# Patient Record
Sex: Female | Born: 1975 | Hispanic: Yes | Marital: Married
Health system: Southern US, Community
[De-identification: ages and names within clinical notes are randomized; demographics above are authoritative.]

---

## 2007-04-08 ENCOUNTER — Ambulatory Visit: Payer: Self-pay

## 2007-08-15 ENCOUNTER — Inpatient Hospital Stay: Payer: Self-pay | Admitting: Obstetrics and Gynecology

## 2008-01-22 IMAGING — US US OB US >=[ID] SNGL FETUS
1 series · 17 of 28 positions shown · non-contrast
Comparison: none

REASON FOR EXAM: size and dates   needs spanish interpreter
COMMENTS:

[Series 1: us ob us >=(id) sngl fetus · 17 of 64 slices shown]
[im 1/64]
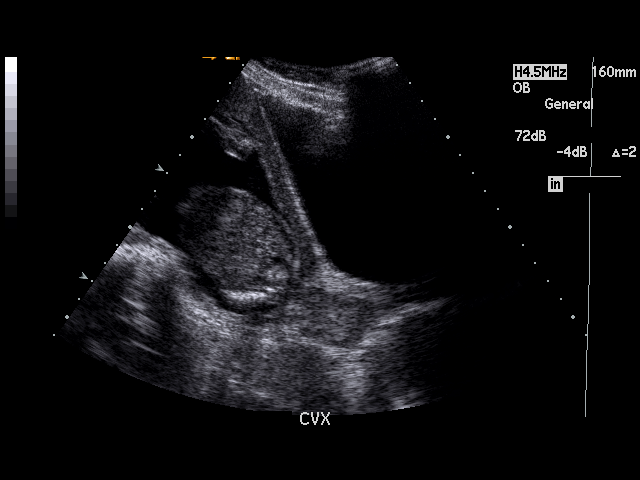
[im 5/64]
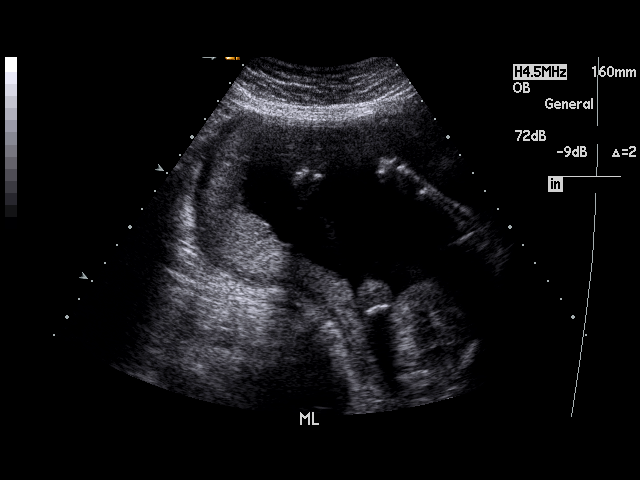
[im 10/64]
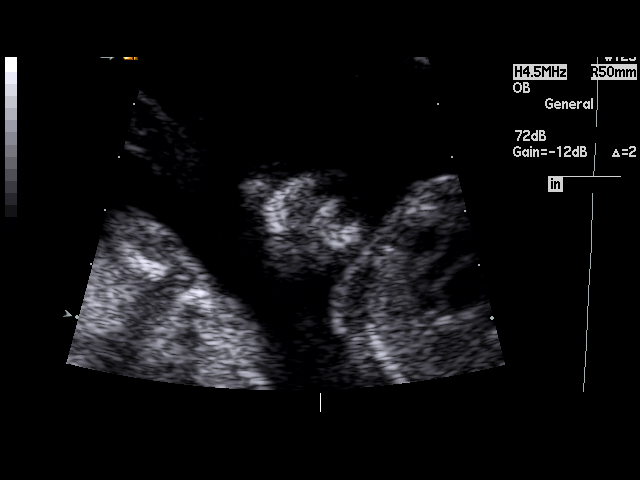
[im 12/64]
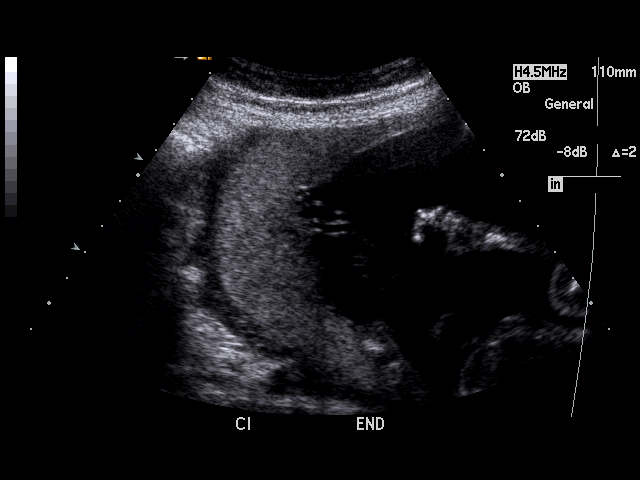
[im 17/64]
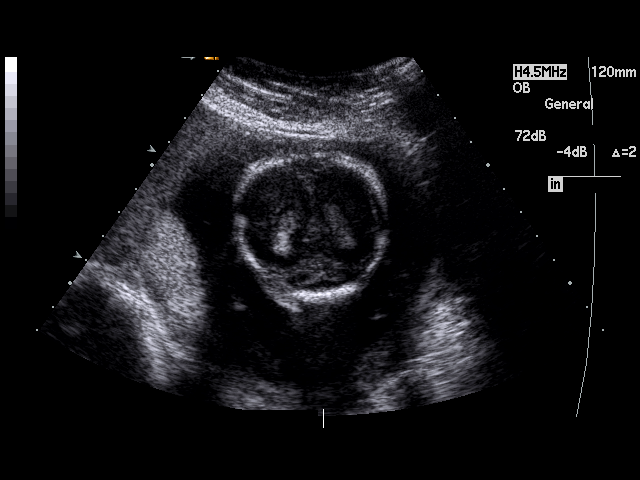
[im 22/64]
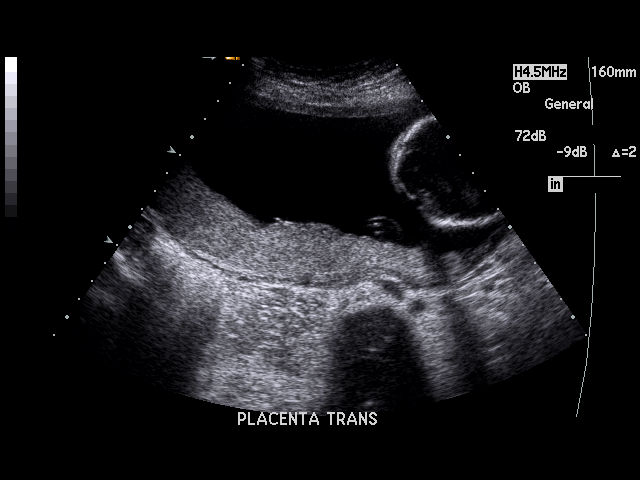
[im 24/64]
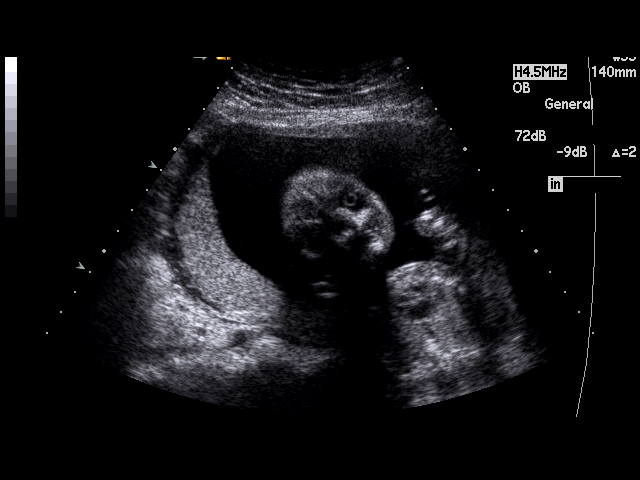
[im 29/64]
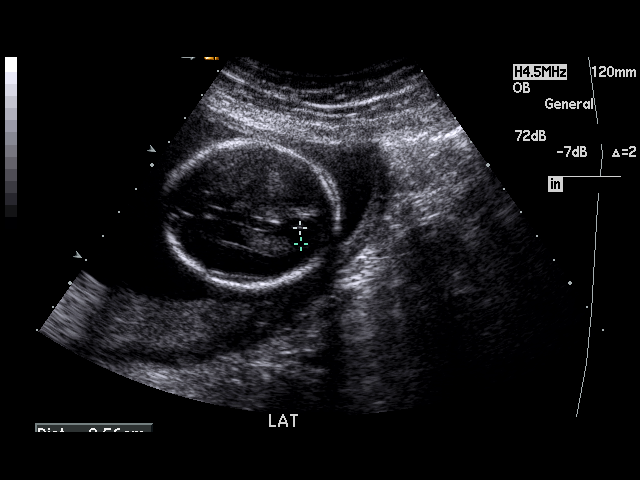
[im 33/64]
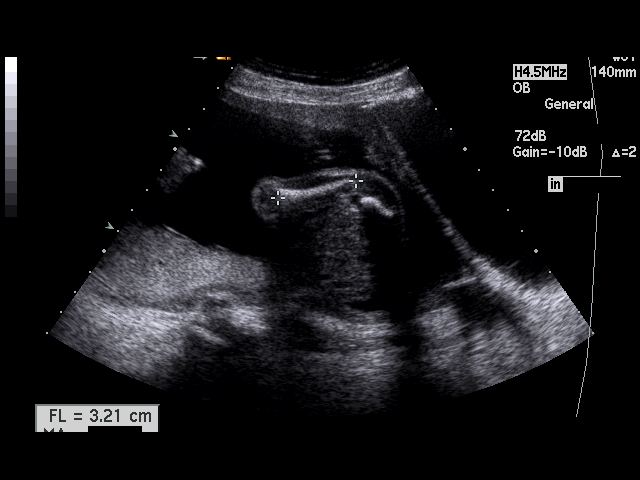
[im 36/64]
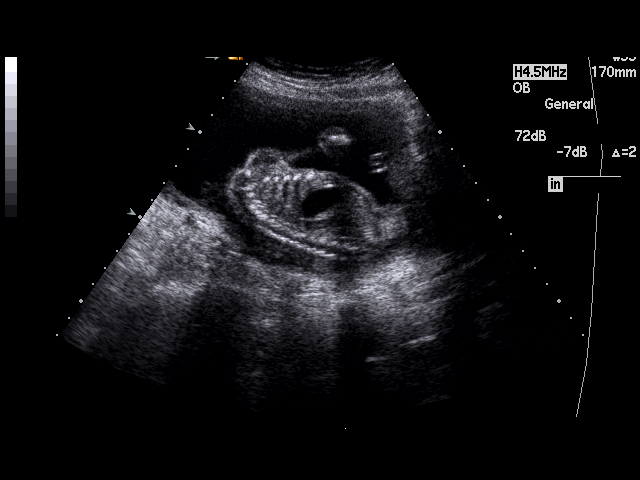
[im 40/64]
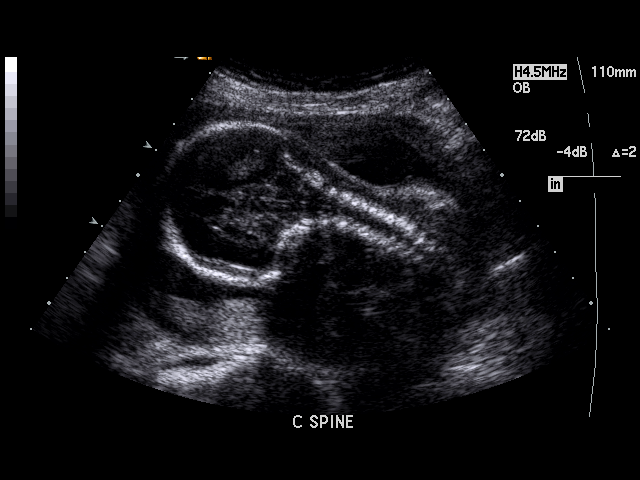
[im 43/64]
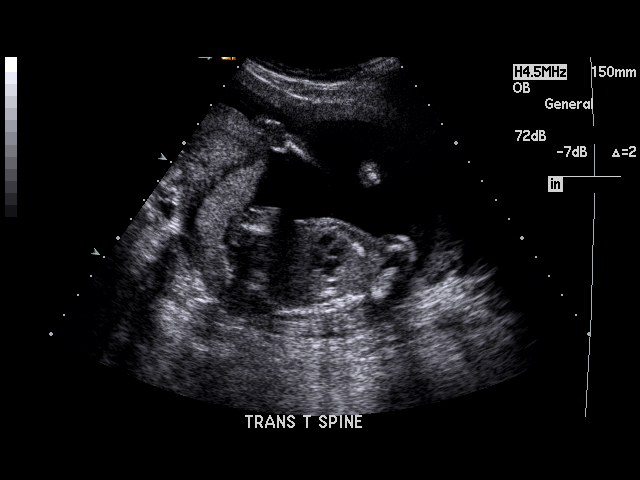
[im 47/64]
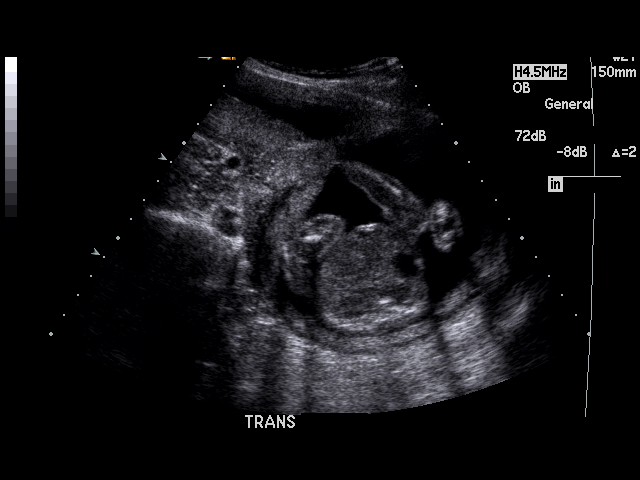
[im 52/64]
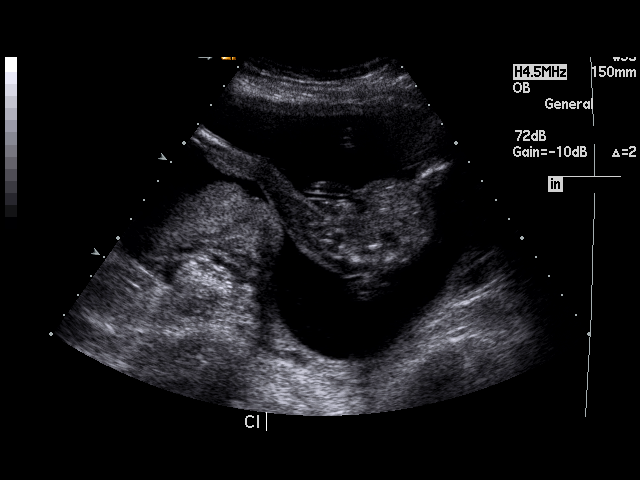
[im 54/64]
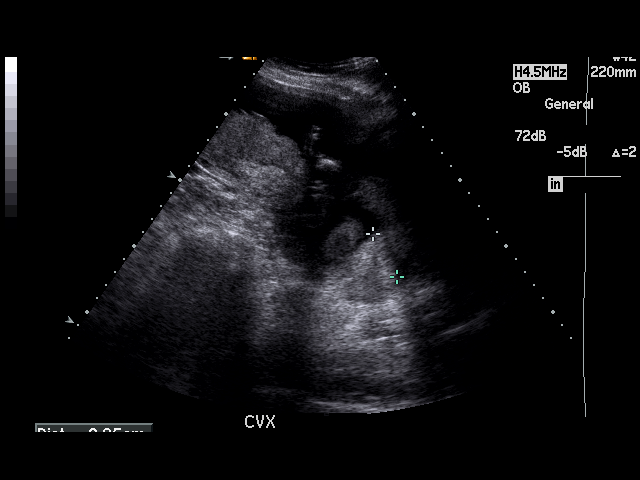
[im 59/64]
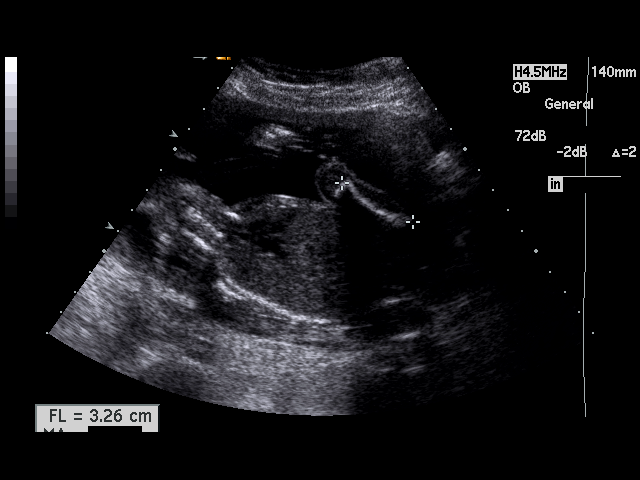
[im 64/64]
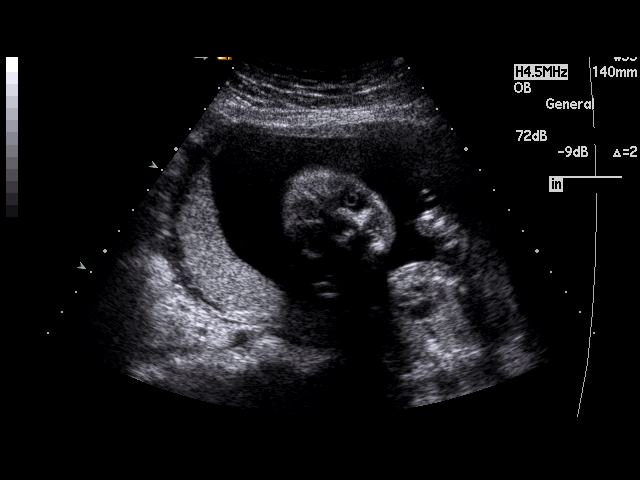

[17 of 28 positions shown; findings below may reference images not displayed]

PROCEDURE:     US  - US OB GREATER/OR EQUAL TO [id number]  - April 08, 2007  [DATE]

RESULT:       A single viable intrauterine pregnancy is appreciated with
estimated gestational age by ultrasound of 20 weeks 3 days.  Cardiac, trunk
and extremity and diaphragmatic movement is appreciated.  Estimated fetal
heart rate is 153 beats per minute. Amniotic fluid index visually appears to
be unremarkable.  The placenta is Grade O and is in a posterior location.
Evaluation of the fetal anatomy demonstrates no gross bladder, kidneys,
stomach, cardiac, diaphragm, spine or ventricular abnormality.  Spine
evaluation is limited secondary to the fetus age.

Fetal biometry:

BPD     5.05 cm     EGA 21 weeks 2 days
HC          17.77 cm     EGA 20 weeks 2 days
AC          15.35 cm     EGA 20 weeks 4 days
FL          3.23 cm     EGA 20 weeks 1 day

Estimated fetal weight is 13 ounces + / - 2 ounces which corresponds to 375
grams + / -50 grams.
IMPRESSION: Single viable intrauterine pregnancy as described above.

## 2012-10-13 ENCOUNTER — Encounter: Payer: Self-pay | Admitting: Obstetrics and Gynecology

## 2013-03-02 ENCOUNTER — Inpatient Hospital Stay: Payer: Self-pay | Admitting: Obstetrics and Gynecology

## 2013-03-02 LAB — CBC WITH DIFFERENTIAL/PLATELET
Basophil #: 0.1 10*3/uL (ref 0.0–0.1)
Basophil %: 0.7 %
HCT: 37.3 % (ref 35.0–47.0)
Lymphocyte %: 20.1 %
MCH: 29.3 pg (ref 26.0–34.0)
Monocyte %: 8.9 %
Neutrophil #: 6.2 10*3/uL (ref 1.4–6.5)
Neutrophil %: 69.4 %
RDW: 14.2 % (ref 11.5–14.5)

## 2013-03-03 LAB — HEMATOCRIT: HCT: 24.4 % — ABNORMAL LOW (ref 35.0–47.0)

## 2013-07-29 IMAGING — US US OB DETAIL+14 WK - NRPT [facility]
1 series · 14 of 28 positions shown · non-contrast
Comparison: none

[Series 1: us ob detail+14 wk - nrpt [facility] · 0.26mm/px · 14 of 97 slices shown]
[im 4/97]
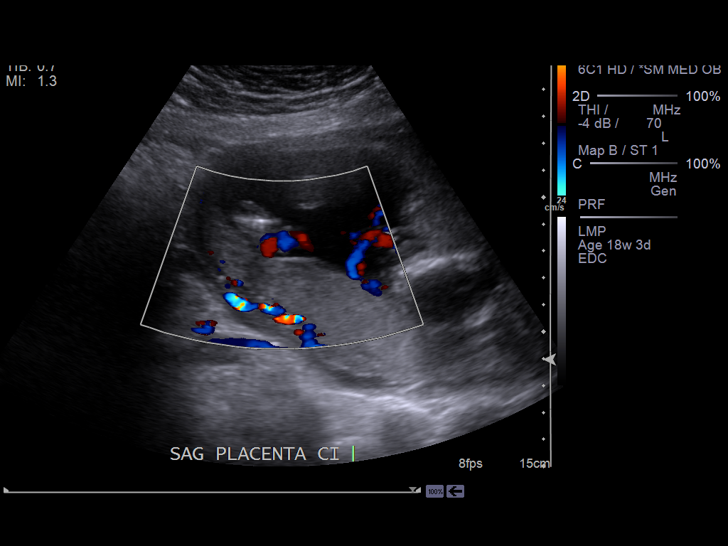
[im 11/97]
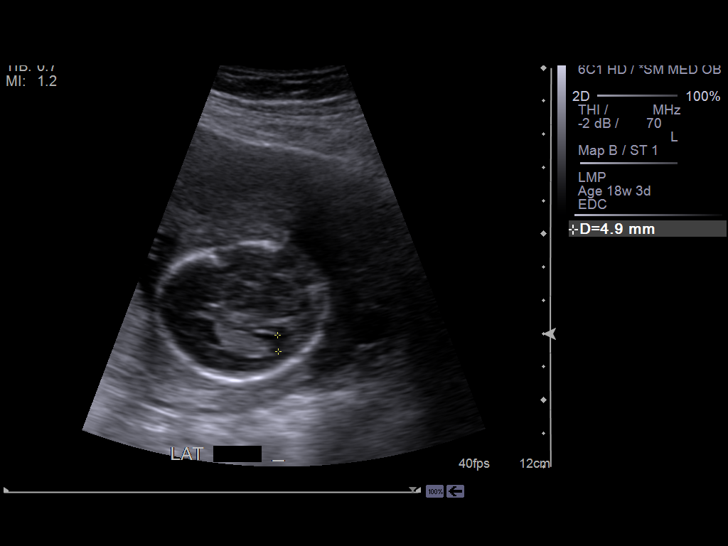
[im 18/97]
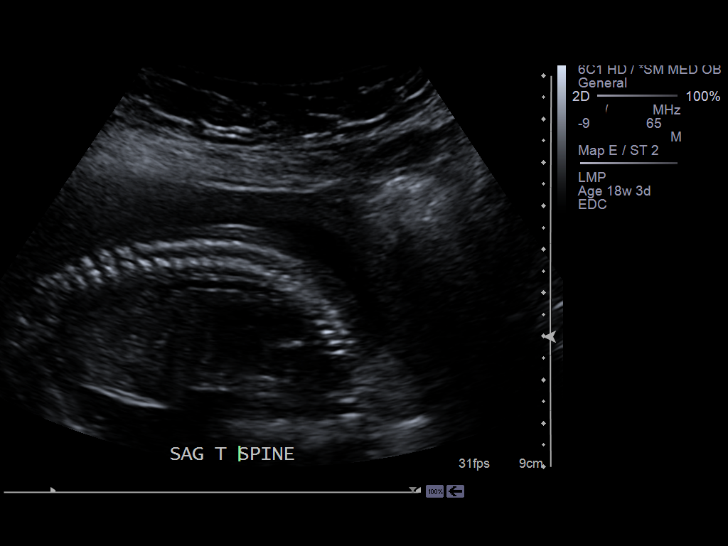
[im 25/97]
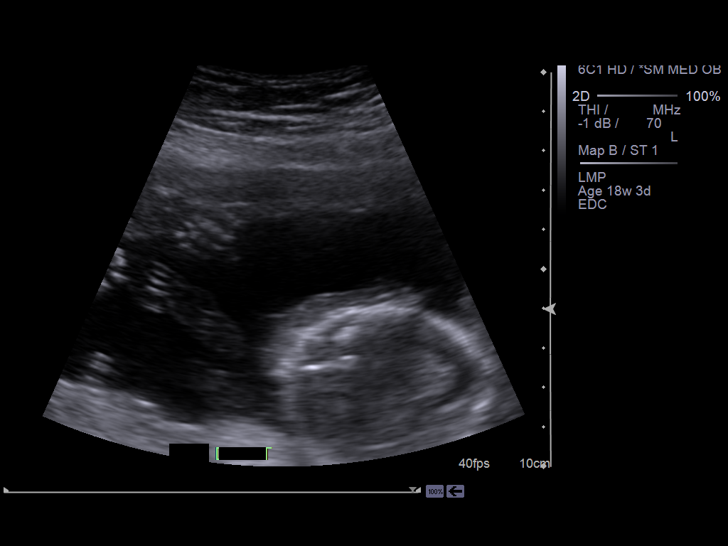
[im 33/97]
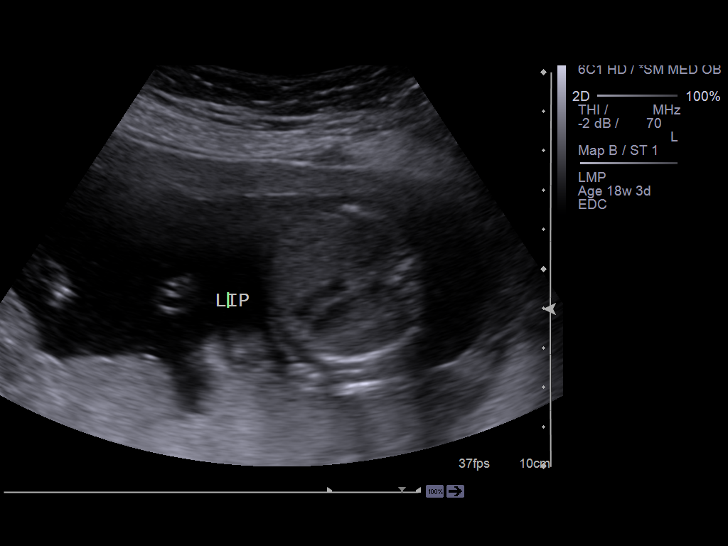
[im 40/97]
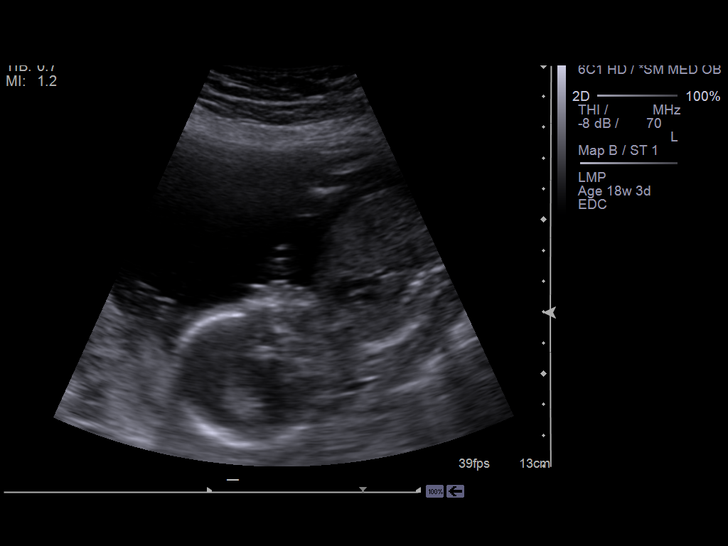
[im 47/97]
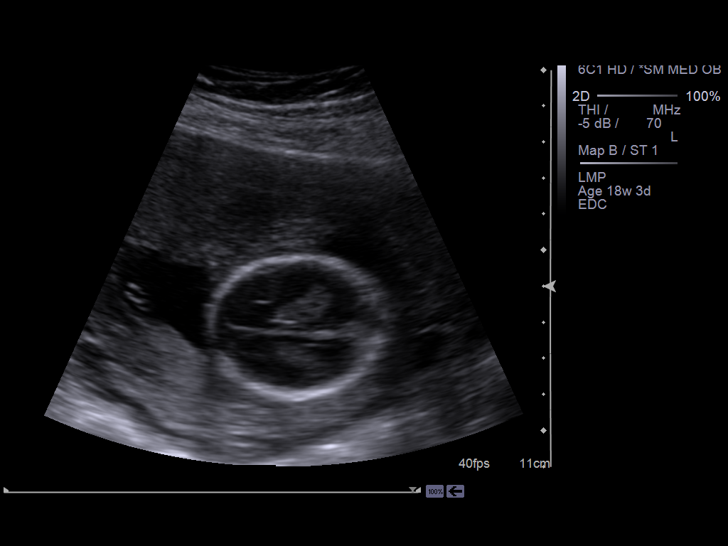
[im 54/97]
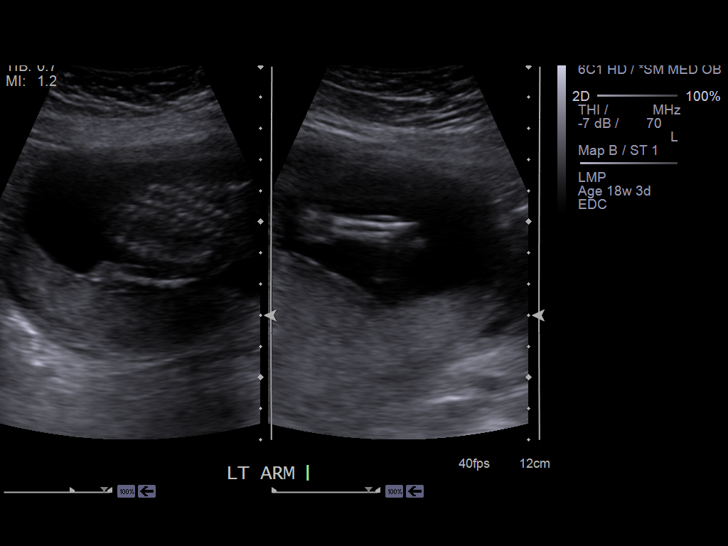
[im 61/97]
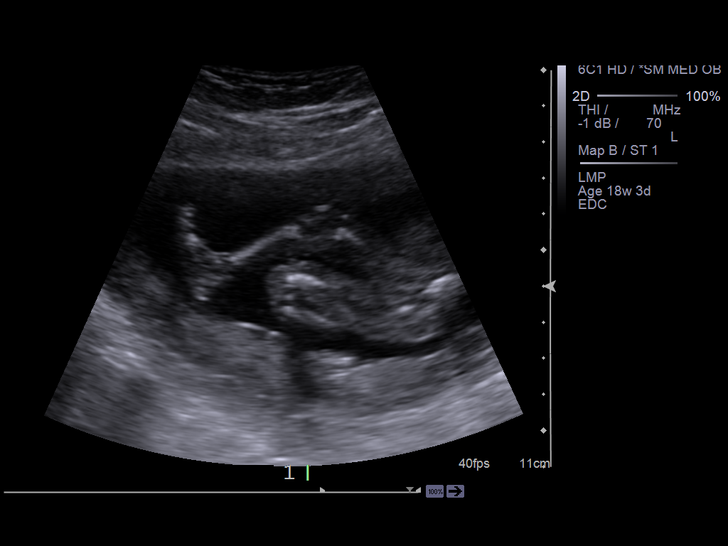
[im 68/97]
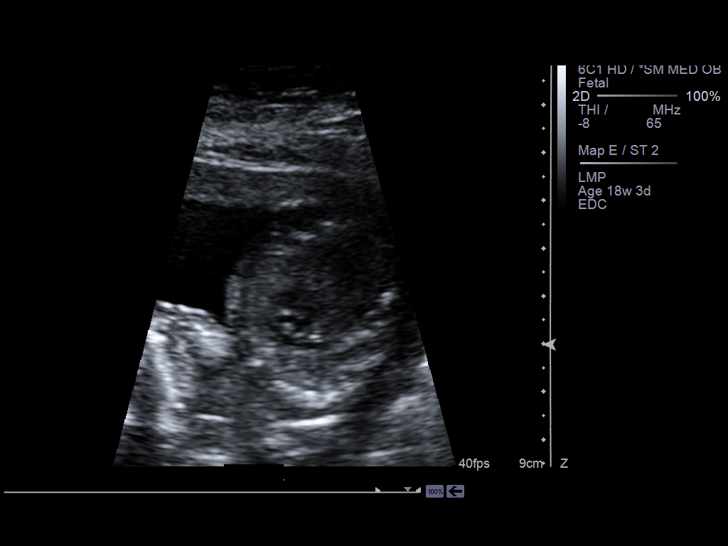
[im 75/97]
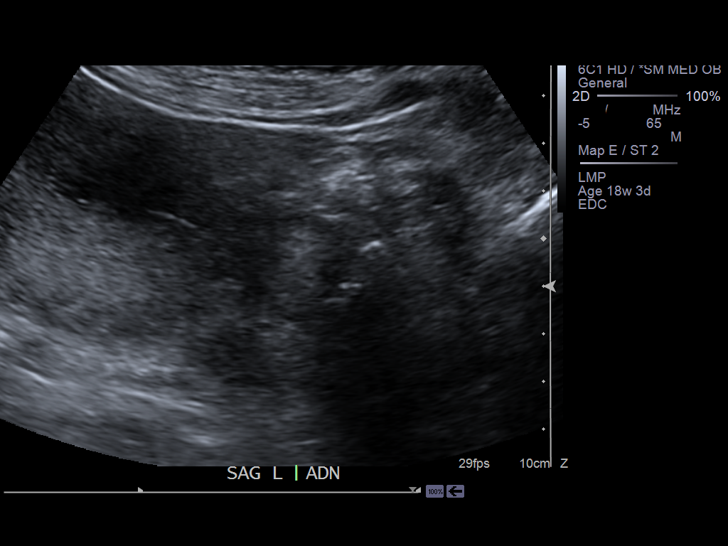
[im 82/97]
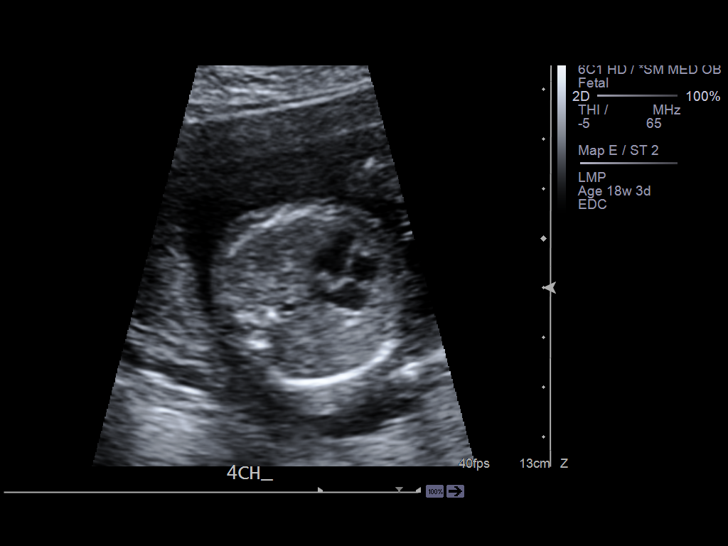
[im 89/97]
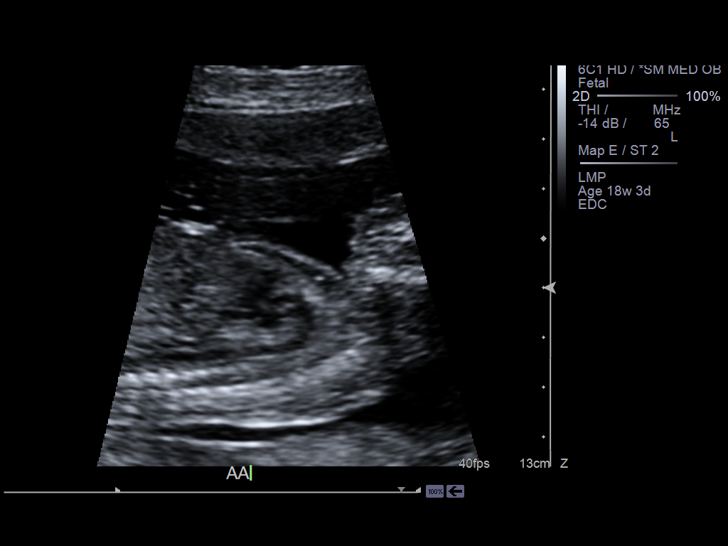
[im 97/97]
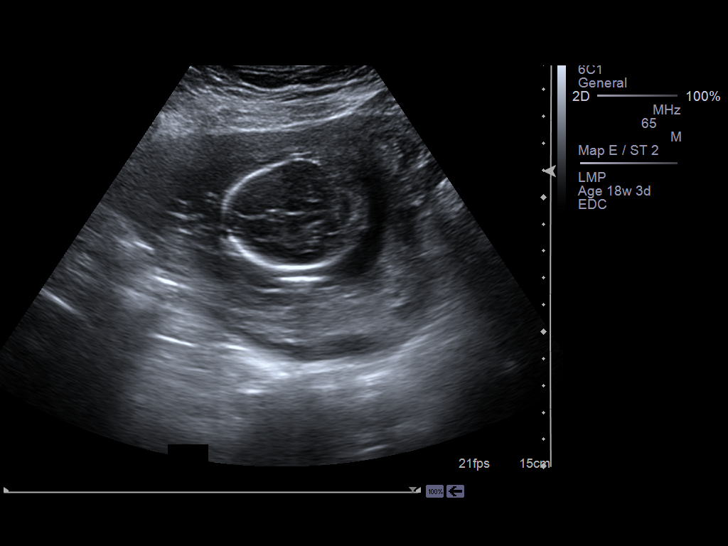

[14 of 28 positions shown; findings below may reference images not displayed]

IMAGES IMPORTED FROM THE [employer] WORKFLOW SYSTEM
NO DICTATION FOR STUDY

## 2015-03-22 NOTE — H&P (Signed)
L&D Evaluation:  History:  HPI 39 y/o [id number] @ 38wks+ EDC 03/13/13 arrived @ 0200 this am with c/o regular contractions, denied leaking fluid or vaginal bleeding. Baby active[facility]  AMA GBS negative.   Presents with contractions   Patient's Medical History No Chronic Illness   Patient's Surgical History none   Medications Pre Natal Vitamins   Allergies NKDA   Social History none   Family History Non-Contributory   ROS:  ROS All systems were reviewed.  HEENT, CNS, GI, GU, Respiratory, CV, Renal and Musculoskeletal systems were found to be normal.   Exam:  Vital Signs stable   Urine Protein not completed   General no apparent distress   Chest clear   Heart normal sinus rhythm   Estimated Fetal Weight Average for gestational age   Fetal Position vtx   Fundal Height term   Back no CVAT   Edema no edema   Reflexes 1+   Pelvic no external lesions, 5cm upon arrival, progressed to 10 cm this am  vtx @ 0 station clear fluid AROM nl show   Mebranes Ruptured, AROM   Description clear   FHT normal rate with no decels, baseline 140's 150's avg variability with accels   Fetal Heart Rate 146   Ucx spaced out to 04/19/09 minutes through the night   Skin dry   Lymph no lymphadenopathy   Impression:  Impression active labor   Plan:  Plan EFM/NST, monitor contractions and for cervical change   Comments Breathing thru contractions well this am, FOB supportive at bedside. Interpreter present. Pitocin augment begun, some urge to push with uc's. Anticipate vaginal deliverry.   Electronic Signatures: Albertina ParrLugiano, Kylan Veach B (CNM)  (Signed 21-Apr-14 09:21)  Authored: L&D Evaluation   Last Updated: 21-Apr-14 09:21 by Albertina ParrLugiano, Nitzia Perren B (CNM)
